# Patient Record
Sex: Female | Born: 1935 | Race: White | Hispanic: No | Marital: Married | Smoking: Never smoker
Health system: Southern US, Community
[De-identification: ages and names within clinical notes are randomized; demographics above are authoritative.]

## PROBLEM LIST (undated history)

## (undated) DIAGNOSIS — M1990 Unspecified osteoarthritis, unspecified site: Secondary | ICD-10-CM

## (undated) DIAGNOSIS — H3530 Unspecified macular degeneration: Secondary | ICD-10-CM

## (undated) DIAGNOSIS — Z8719 Personal history of other diseases of the digestive system: Secondary | ICD-10-CM

## (undated) DIAGNOSIS — K5900 Constipation, unspecified: Secondary | ICD-10-CM

## (undated) DIAGNOSIS — Z9289 Personal history of other medical treatment: Secondary | ICD-10-CM

## (undated) DIAGNOSIS — I2699 Other pulmonary embolism without acute cor pulmonale: Secondary | ICD-10-CM

## (undated) DIAGNOSIS — I10 Essential (primary) hypertension: Secondary | ICD-10-CM

## (undated) DIAGNOSIS — I82409 Acute embolism and thrombosis of unspecified deep veins of unspecified lower extremity: Secondary | ICD-10-CM

## (undated) DIAGNOSIS — R011 Cardiac murmur, unspecified: Secondary | ICD-10-CM

## (undated) HISTORY — PX: TONSILLECTOMY: SUR1361

## (undated) HISTORY — PX: SHOULDER SURGERY: SHX246

## (undated) HISTORY — PX: EYE SURGERY: SHX253

## (undated) HISTORY — PX: OTHER SURGICAL HISTORY: SHX169

## (undated) HISTORY — PX: ABDOMINAL HYSTERECTOMY: SHX81

---

## 2016-06-02 NOTE — H&P (Signed)
TOTAL HIP ADMISSION H&P  Patient is admitted for left total hip arthroplasty.  Subjective:  Chief Complaint: left hip pain  HPI: Sheila Stone, 83 y.o. female, has a history of pain and functional disability in the left hip(s) due to arthritis and patient has failed non-surgical conservative treatments for greater than 12 weeks to include NSAID's and/or analgesics, corticosteriod injections, flexibility and strengthening excercises, use of assistive devices, weight reduction as appropriate and activity modification.  Onset of symptoms was gradual starting 5 years ago with gradually worsening course since that time.The patient noted no past surgery on the left hip(s).  Patient currently rates pain in the left hip at 10 out of 10 with activity. Patient has night pain, worsening of pain with activity and weight bearing, trendelenberg gait, pain that interfers with activities of daily living and crepitus. Patient has evidence of subchondral cysts, subchondral sclerosis, periarticular osteophytes and joint space narrowing by imaging studies. This condition presents safety issues increasing the risk of falls. There is no current active infection.  There are no active problems to display for this patient.  No past medical history on file.  No past surgical history on file.  No prescriptions prior to admission.   Allergies  Allergen Reactions  . Other     Alcohol - throat swells     Social History  Substance Use Topics  . Smoking status: Not on file  . Smokeless tobacco: Not on file  . Alcohol use Not on file    No family history on file.   Review of Systems  Musculoskeletal: Positive for joint pain.       Left hip  All other systems reviewed and are negative.   Objective:  Physical Exam  Constitutional: She is oriented to person, place, and time. She appears well-developed and well-nourished.  HENT:  Head: Normocephalic and atraumatic.  Eyes: Pupils are equal, round, and  reactive to light.  Neck: Normal range of motion.  Cardiovascular: Normal rate and regular rhythm.   Respiratory: Effort normal.  GI: Soft.  Musculoskeletal:  Examination left hip shows pain with internal rotation.  Range of motion is rather limited.  No tenderness to palpation laterally.  She has 5 out of 5 strength normal sensation to her left lower extremity.  She is neurovascularly intact distally.   Neurological: She is alert and oriented to person, place, and time.  Skin: Skin is warm and dry.  Psychiatric: She has a normal mood and affect. Her behavior is normal. Judgment and thought content normal.    Vital signs in last 24 hours:    Labs:   There is no height or weight on file to calculate BMI.   Imaging Review Plain radiographs demonstrate severe degenerative joint disease of the left hip(s). The bone quality appears to be good for age and reported activity level.  Assessment/Plan:  End stage primary arthritis, left hip(s)  The patient history, physical examination, clinical judgement of the provider and imaging studies are consistent with end stage degenerative joint disease of the left hip(s) and total hip arthroplasty is deemed medically necessary. The treatment options including medical management, injection therapy, arthroscopy and arthroplasty were discussed at length. The risks and benefits of total hip arthroplasty were presented and reviewed. The risks due to aseptic loosening, infection, stiffness, dislocation/subluxation,  thromboembolic complications and other imponderables were discussed.  The patient acknowledged the explanation, agreed to proceed with the plan and consent was signed. Patient is being admitted for inpatient treatment for surgery,  pain control, PT, OT, prophylactic antibiotics, VTE prophylaxis, progressive ambulation and ADL's and discharge planning.The patient is planning to be discharged home with home health services

## 2016-06-04 ENCOUNTER — Encounter (HOSPITAL_COMMUNITY): Payer: Self-pay | Admitting: *Deleted

## 2016-06-04 ENCOUNTER — Encounter (HOSPITAL_COMMUNITY)
Admission: RE | Admit: 2016-06-04 | Discharge: 2016-06-04 | Disposition: A | Discharge status: Home / Self Care | Payer: Medicare Other | Source: Ambulatory Visit | Attending: Orthopaedic Surgery | Admitting: Orthopaedic Surgery

## 2016-06-04 DIAGNOSIS — I10 Essential (primary) hypertension: Secondary | ICD-10-CM | POA: Diagnosis not present

## 2016-06-04 DIAGNOSIS — R010 Benign and innocent cardiac murmurs: Secondary | ICD-10-CM | POA: Diagnosis not present

## 2016-06-04 DIAGNOSIS — Z01812 Encounter for preprocedural laboratory examination: Secondary | ICD-10-CM | POA: Diagnosis present

## 2016-06-04 HISTORY — DX: Other pulmonary embolism without acute cor pulmonale: I26.99

## 2016-06-04 HISTORY — DX: Unspecified osteoarthritis, unspecified site: M19.90

## 2016-06-04 HISTORY — DX: Acute embolism and thrombosis of unspecified deep veins of unspecified lower extremity: I82.409

## 2016-06-04 HISTORY — DX: Personal history of other diseases of the digestive system: Z87.19

## 2016-06-04 HISTORY — DX: Cardiac murmur, unspecified: R01.1

## 2016-06-04 HISTORY — DX: Essential (primary) hypertension: I10

## 2016-06-04 HISTORY — DX: Unspecified macular degeneration: H35.30

## 2016-06-04 HISTORY — DX: Personal history of other medical treatment: Z92.89

## 2016-06-04 HISTORY — DX: Constipation, unspecified: K59.00

## 2016-06-04 LAB — BASIC METABOLIC PANEL
ANION GAP: 7 (ref 5–15)
BUN: 20 mg/dL (ref 6–20)
CALCIUM: 9.3 mg/dL (ref 8.9–10.3)
CO2: 28 mmol/L (ref 22–32)
Chloride: 104 mmol/L (ref 101–111)
Creatinine, Ser: 0.85 mg/dL (ref 0.44–1.00)
GFR calc Af Amer: >60 mL/min (ref >60)
GFR calc non Af Amer: >60 mL/min (ref >60)
GLUCOSE: 97 mg/dL (ref 65–99)
POTASSIUM: 4.6 mmol/L (ref 3.5–5.1)
Sodium: 139 mmol/L (ref 135–145)

## 2016-06-04 LAB — SURGICAL PCR SCREEN
MRSA, PCR: NEGATIVE
Staphylococcus aureus: NEGATIVE

## 2016-06-04 LAB — CBC
HEMATOCRIT: 38.6 % (ref 36.0–46.0)
HEMOGLOBIN: 13.1 g/dL (ref 12.0–15.0)
MCH: 30.8 pg (ref 26.0–34.0)
MCHC: 33.9 g/dL (ref 30.0–36.0)
MCV: 90.6 fL (ref 78.0–100.0)
Platelets: 203 10*3/uL (ref 150–400)
RBC: 4.26 MIL/uL (ref 3.87–5.11)
RDW: 13.6 % (ref 11.5–15.5)
WBC: 4.4 10*3/uL (ref 4.0–10.5)

## 2016-06-04 NOTE — Pre-Procedure Instructions (Signed)
    Sheila Stone  06/04/2016    Your procedure is scheduled on Tuesday, May 22.  [facility] 10:00 AM               For any other questions, please call [phone], Monday - Friday 8 AM - 4 PM.   Call this number if you have problems the morning of surgery:[phone]                   For any other questions, please call [phone], Monday - Friday 8 AM - 4 PM.   Remember:  Do not eat food or drink liquids after midnight Monday, May 21.  Take these medicines the morning of surgery with A SIP OF WATER: levothyroxine (SYNTHROID, LEVOTHROID), omeprazole (PRILOSEC).  Take if needed and if you tolerate it on an empty stomach: HYDROcodone-acetaminophen (NORCO).                    Aspirin as instructed by your surgeon.            STOP taking Aspirin Products (Goody Powder, Excedrin Migraine), Ibuprofen (Advil), Naproxen (Aleve), Viatiams and Herbal Products (ie Fish Oil).   Do not wear jewelry, make-up or nail polish.  Do not wear lotions, powders, or perfumes, or deodorant.  Do not shave 48 hours prior to surgery.  Men may shave face and neck.  Do not bring valuables to the hospital.  [facility] is not responsible for any belongings or valuables.  Contacts, dentures or bridgework may not be worn into surgery.  Leave your suitcase in the car.  After surgery it may be brought to your room.  For patients admitted to the hospital, discharge time will be determined by your treatment team.   Special instructions: Review  [place] - Preparing For Surgery.  Please read over the following fact sheets that you were given: [facility]- Preparing For Surgery and Patient Instructions for Mupirocin Application, Coughing and Deep Breathing, Pain Booklet, Surgical Site Infections

## 2016-06-04 NOTE — Progress Notes (Signed)
Sheila Stone denies chest pain or shortness of breath.  Patient was seen in ED at [facility] for chest apian in 11/2015- MI ruled out, patient has a Hitall Hernia. Patient has not had chest pain since ED visit.  Discharge note mentions Afib, - request cardiology visit post hospitalization, patient was unaware.Patient's PCP is Dr Dina RichManckamm.  Patient has a history of DVT after an injury to leg resulting in a PE.  Patient thinks this was around 2008, patient has been on Aspirin 81 mg since.  Dr Jerl Santosalldorf did not instruct patient to stop.

## 2016-06-04 NOTE — Progress Notes (Signed)
  [place]- Preparing For Surgery  Before surgery, you can play an important role. Because skin is not sterile, your skin needs to be as free of germs as possible. You can reduce the number of germs on your skin by washing with CHG (chlorahexidine gluconate) Soap before surgery.  CHG is an antiseptic cleaner which kills germs and bonds with the skin to continue killing germs even after washing.  Please do not use if you have an allergy to CHG or antibacterial soaps. If your skin becomes reddened/irritated stop using the CHG.  Do not shave (including legs and underarms) for at least 48 hours prior to first CHG shower. It is OK to shave your face.  Please follow these instructions carefully.   1. Shower the NIGHT BEFORE SURGERY and the MORNING OF SURGERY with CHG.   2. If you chose to wash your hair, wash your hair first as usual with your normal shampoo.  3. After you shampoo, rinse your hair and body thoroughly to remove the shampoo.  Then wash your face and private area with your personal soap, then rinse. 4. Use CHG as you would any other liquid soap. You can apply CHG directly to the skin and wash gently with a scrungie or a clean washcloth.   5. Apply the CHG Soap to your body ONLY FROM THE NECK DOWN.  Do not use on open wounds or open sores. Avoid contact with your eyes, ears, mouth and genitals (private parts). Wash genitals (private parts) with your normal soap.  6. Wash thoroughly, paying special attention to the area where your surgery will be performed.  7. Thoroughly rinse your body with warm water from the neck down.  8. DO NOT shower/wash with your normal soap after using and rinsing off the CHG Soap.  9. Pat yourself dry with a CLEAN TOWEL.   10. Wear CLEAN PAJAMAS   11. Place CLEAN SHEETS on your bed the night of your first shower and DO NOT SLEEP WITH PETS.   Day of Surgery: Do not apply any deodorants/lotions. Please wear clean clothes to the hospital/surgery  center.

## 2016-06-05 NOTE — Progress Notes (Addendum)
Anesthesia Chart Review: Patient is a 83 year old female scheduled for left THA, anterior approach on 06/09/16 by Dr. Jerl Santosalldorf.   History includes never smoker, HTN, murmur ("mitral valve"), DVT(following injury)/PE '08, GI bleed (NSAIDS), tonsillectomy, hysterectomy, varicose vein surgery, shoulder surgery. She was [facility] 11/30/15-12/02/15 for chest pain. Notes indicate that she negative troponin X 2, CTA chest was negative for PE bug showed incidental 5 mm LLL lung nodule. She had a non-ischemic stress test and echo showed EF 50-55% with moderate TR and mildly dilated ascending TAA. Of note, records also mention that an EKG from that admission showed afib, but both EKGs tracings reviewed, and I do not see afib--both tracings have predominantly sinus rhythm with visibile P waves but one with couplet PACs and one with a ~ 1.6 second pause.   PCP is Dr. Secundino GingerPanneer Stone [facility] (see Care Everywhere), last visit 05/25/16.  Meds include aspirin 81 mg, Lipitor, Norco, levothyroxine, lidocaine patch, losartan, omeprazole, Maxide 25.  EKGs from 11/30/15 [facility] reviewed. 18:09:50 showed ST at 103 bpm, occasional PACs (one couplet), non-specific ST abnormality. 22:16:00 showed NSR, sinus pause (~ 1.6 seconds), reverse r wave progression V2-3, occasional PAC.  Nuclear stress test 12/02/15 [facility]; Care Everywhere): IMPRESSION: No evidence of inducible ischemia. EF 73%.  Echo 12/01/15 ([facility]; Care Everywhere): Interpretation Summary The left ventricle is normal in size. There is mild concentric left ventricular hypertrophy. The left ventricular ejection fraction is normal (50-55%). The left ventricular wall motion is normal. Grade I mild diastolic dysfunction; abnormal relaxation pattern. The left and right atria are mildly dilated. There is trace mitral regurgitation. There is moderate (2+) tricuspid regurgitation. Dilated ascending aorta measuring 4cm in  diameter.  CTA Pulmonary 11/30/15 [facility]; Care Everywhere): IMPRESSION: 1.No CT evidence of acute pulmonary embolism. No acute aortic abnormality. 2.Large hiatal hernia/intrathoracic stomach. 3.Scattered atelectasis without evidence of pneumonia. 4.Small bilateral pulmonary measuring up to 5 mm in the left lower lobe. Reference Fleischner criteria guidelines below for follow-up recommendations. 5.Atherosclerosis including coronary artery disease. 6.Hypodense left thyroid, hepatic, and splenic lesions are incompletely characterized by CT. Excerpt from "Guidelines for Management of Incidental Pulmonary Nodules Detected on CT Images: From the Fleischner Society 2017"Radiology 2017; 000:1-16. Note: Guidelines do not apply to lung cancer screening, patients with immunosuppression, or patients with known primary tumor. Solid Single Nodule: 5 mm or smaller:  Low Risk: No routine follow-up High Risk: Optional CT at 12 months (Use most suspicious nodule as guide to management. Follow-up intervals may vary according to size and risk).  Preoperative labs noted. CBC and BMET WNL.  Reviewed case with Dr. Aleene DavidsonE. Fitzgerald, MD.  Will repeat EKG DOS. If acceptable, I anticipate pt can proceed as scheduled.   Sheila Mastngela Stone, Sheila Stone [facility]/Anesthesiology Phone: [phone] 06/05/2016 12:06 PM  Addendum: Sheila MessierKathy called from Dr. Atlee AbideManickam's office to acknowledge review of ED chest CTA from 11/30/15 showing small pulmonary nodules. He felt she was low risk (never smoker), but would review results with her and determine definitive plan for follow-up at her next visit.   Sheila Ochsllison Sheila Cossey, PA-C [facility]/Anesthesiology Phone [phone] 06/08/2016 11:20 AM

## 2016-06-09 ENCOUNTER — Encounter (HOSPITAL_COMMUNITY)
Admission: RE | Disposition: A | Discharge status: Home Health | Payer: Self-pay | Source: Ambulatory Visit | Attending: Orthopaedic Surgery

## 2016-06-09 ENCOUNTER — Inpatient Hospital Stay (HOSPITAL_COMMUNITY): Payer: Medicare Other

## 2016-06-09 ENCOUNTER — Inpatient Hospital Stay (HOSPITAL_COMMUNITY): Payer: Medicare Other | Admitting: Certified Registered Nurse Anesthetist

## 2016-06-09 ENCOUNTER — Inpatient Hospital Stay (HOSPITAL_COMMUNITY)
Admission: RE | Admit: 2016-06-09 | Discharge: 2016-06-10 | DRG: 470 | Disposition: A | Discharge status: Home Health | Payer: Medicare Other | Source: Ambulatory Visit | Attending: Orthopaedic Surgery | Admitting: Orthopaedic Surgery

## 2016-06-09 ENCOUNTER — Encounter (HOSPITAL_COMMUNITY): Payer: Self-pay | Admitting: Certified Registered Nurse Anesthetist

## 2016-06-09 ENCOUNTER — Inpatient Hospital Stay (HOSPITAL_COMMUNITY): Payer: Medicare Other | Admitting: Vascular Surgery

## 2016-06-09 DIAGNOSIS — Z86711 Personal history of pulmonary embolism: Secondary | ICD-10-CM

## 2016-06-09 DIAGNOSIS — M1612 Unilateral primary osteoarthritis, left hip: Secondary | ICD-10-CM | POA: Diagnosis not present

## 2016-06-09 DIAGNOSIS — M25552 Pain in left hip: Secondary | ICD-10-CM | POA: Diagnosis present

## 2016-06-09 DIAGNOSIS — Z419 Encounter for procedure for purposes other than remedying health state, unspecified: Secondary | ICD-10-CM

## 2016-06-09 DIAGNOSIS — H3530 Unspecified macular degeneration: Secondary | ICD-10-CM | POA: Diagnosis not present

## 2016-06-09 DIAGNOSIS — I10 Essential (primary) hypertension: Secondary | ICD-10-CM | POA: Diagnosis present

## 2016-06-09 HISTORY — PX: TOTAL HIP ARTHROPLASTY: SHX124

## 2016-06-09 LAB — DIFFERENTIAL
Basophils Absolute: 0 10*3/uL (ref 0.0–0.1)
Basophils Relative: 0 %
EOS ABS: 0.1 10*3/uL (ref 0.0–0.7)
EOS PCT: 2 %
LYMPHS ABS: 1.3 10*3/uL (ref 0.7–4.0)
Lymphocytes Relative: 27 %
MONO ABS: 0.3 10*3/uL (ref 0.1–1.0)
MONOS PCT: 6 %
Neutro Abs: 3.1 10*3/uL (ref 1.7–7.7)
Neutrophils Relative %: 65 %

## 2016-06-09 LAB — PROTIME-INR
INR: 0.99
Prothrombin Time: 13.1 seconds (ref 11.4–15.2)

## 2016-06-09 LAB — URINALYSIS, ROUTINE W REFLEX MICROSCOPIC
BACTERIA UA: NONE SEEN
BILIRUBIN URINE: NEGATIVE
Glucose, UA: NEGATIVE mg/dL
Hgb urine dipstick: NEGATIVE
KETONES UR: NEGATIVE mg/dL
Nitrite: NEGATIVE
PH: 7 (ref 5.0–8.0)
Protein, ur: NEGATIVE mg/dL
Specific Gravity, Urine: 1.01 (ref 1.005–1.030)

## 2016-06-09 LAB — TYPE AND SCREEN
ABO/RH(D): A POS
ANTIBODY SCREEN: NEGATIVE

## 2016-06-09 LAB — APTT: APTT: 32 s (ref 24–36)

## 2016-06-09 LAB — ABO/RH: ABO/RH(D): A POS

## 2016-06-09 SURGERY — ARTHROPLASTY, HIP, TOTAL, ANTERIOR APPROACH
Anesthesia: Monitor Anesthesia Care | Site: Hip | Laterality: Left

## 2016-06-09 MED ORDER — ACETAMINOPHEN 325 MG PO TABS: 650.0000 mg | ORAL_TABLET | Freq: Four times a day (QID) | ORAL | Status: DC | PRN

## 2016-06-09 MED ORDER — METOCLOPRAMIDE HCL 5 MG/ML IJ SOLN: 5.0000 mg | Freq: Three times a day (TID) | INTRAMUSCULAR | Status: DC | PRN

## 2016-06-09 MED ORDER — HYDROCODONE-ACETAMINOPHEN 7.5-325 MG PO TABS
1.0000 | ORAL_TABLET | ORAL | Status: DC | PRN
  Administered 2016-06-09 – 2016-06-10 (×2): 2 via ORAL
  Administered 2016-06-10: 1 via ORAL
  Administered 2016-06-10: 2 via ORAL
  Administered 2016-06-10: 1 via ORAL
  Administered 2016-06-10: 2 via ORAL
  Filled 2016-06-09 (×2): qty 2
  Filled 2016-06-09: qty 1
  Filled 2016-06-09 (×3): qty 2

## 2016-06-09 MED ORDER — FENTANYL CITRATE (PF) 250 MCG/5ML IJ SOLN
INTRAMUSCULAR | Status: AC
  Filled 2016-06-09: qty 5

## 2016-06-09 MED ORDER — BUPIVACAINE LIPOSOME 1.3 % IJ SUSP
INTRAMUSCULAR | Status: DC | PRN
  Administered 2016-06-09: 20 mL

## 2016-06-09 MED ORDER — BUPIVACAINE HCL (PF) 0.25 % IJ SOLN
INTRAMUSCULAR | Status: AC
  Filled 2016-06-09: qty 30

## 2016-06-09 MED ORDER — PHENOL 1.4 % MT LIQD: 1.0000 | OROMUCOSAL | Status: DC | PRN

## 2016-06-09 MED ORDER — METHOCARBAMOL 1000 MG/10ML IJ SOLN
500.0000 mg | Freq: Four times a day (QID) | INTRAVENOUS | Status: DC | PRN
  Filled 2016-06-09: qty 5

## 2016-06-09 MED ORDER — DIPHENHYDRAMINE-APAP (SLEEP) 25-500 MG PO TABS: 1.0000 | ORAL_TABLET | Freq: Every evening | ORAL | Status: DC | PRN

## 2016-06-09 MED ORDER — ONDANSETRON HCL 4 MG/2ML IJ SOLN
INTRAMUSCULAR | Status: AC
  Filled 2016-06-09: qty 4

## 2016-06-09 MED ORDER — DEXAMETHASONE SODIUM PHOSPHATE 10 MG/ML IJ SOLN
INTRAMUSCULAR | Status: AC
  Filled 2016-06-09: qty 4

## 2016-06-09 MED ORDER — DOCUSATE SODIUM 100 MG PO CAPS
100.0000 mg | ORAL_CAPSULE | Freq: Two times a day (BID) | ORAL | Status: DC
  Administered 2016-06-09: 100 mg via ORAL
  Filled 2016-06-09 (×2): qty 1

## 2016-06-09 MED ORDER — TRIAMTERENE-HCTZ 37.5-25 MG PO TABS
1.0000 | ORAL_TABLET | Freq: Every day | ORAL | Status: DC
  Administered 2016-06-09 – 2016-06-10 (×2): 1 via ORAL
  Filled 2016-06-09 (×2): qty 1

## 2016-06-09 MED ORDER — PROPOFOL 10 MG/ML IV BOLUS
INTRAVENOUS | Status: DC | PRN
  Administered 2016-06-09 (×2): 10 mg via INTRAVENOUS

## 2016-06-09 MED ORDER — HYDROMORPHONE HCL 1 MG/ML IJ SOLN
0.2500 mg | INTRAMUSCULAR | Status: DC | PRN
Start: 2016-06-09 — End: 2016-06-09

## 2016-06-09 MED ORDER — APIXABAN 2.5 MG PO TABS
2.5000 mg | ORAL_TABLET | Freq: Two times a day (BID) | ORAL | Status: DC
  Filled 2016-06-09: qty 1

## 2016-06-09 MED ORDER — ROCURONIUM BROMIDE 10 MG/ML (PF) SYRINGE
PREFILLED_SYRINGE | INTRAVENOUS | Status: AC
  Filled 2016-06-09: qty 5

## 2016-06-09 MED ORDER — PANTOPRAZOLE SODIUM 40 MG PO TBEC
40.0000 mg | DELAYED_RELEASE_TABLET | Freq: Every day | ORAL | Status: DC
  Filled 2016-06-09: qty 1

## 2016-06-09 MED ORDER — MENTHOL 3 MG MT LOZG: 1.0000 | LOZENGE | OROMUCOSAL | Status: DC | PRN

## 2016-06-09 MED ORDER — LIDOCAINE 2% (20 MG/ML) 5 ML SYRINGE
INTRAMUSCULAR | Status: AC
  Filled 2016-06-09: qty 15

## 2016-06-09 MED ORDER — CEFAZOLIN SODIUM-DEXTROSE 2-4 GM/100ML-% IV SOLN
2.0000 g | Freq: Four times a day (QID) | INTRAVENOUS | Status: AC
  Administered 2016-06-09 – 2016-06-10 (×2): 2 g via INTRAVENOUS
  Filled 2016-06-09 (×2): qty 100

## 2016-06-09 MED ORDER — ONDANSETRON HCL 4 MG/2ML IJ SOLN
INTRAMUSCULAR | Status: DC | PRN
  Administered 2016-06-09: 4 mg via INTRAVENOUS

## 2016-06-09 MED ORDER — DIPHENHYDRAMINE HCL 12.5 MG/5ML PO ELIX: 12.5000 mg | ORAL_SOLUTION | ORAL | Status: DC | PRN

## 2016-06-09 MED ORDER — LACTATED RINGERS IV SOLN
INTRAVENOUS | Status: DC
  Administered 2016-06-09 (×2): via INTRAVENOUS

## 2016-06-09 MED ORDER — FENTANYL CITRATE (PF) 100 MCG/2ML IJ SOLN
INTRAMUSCULAR | Status: DC | PRN
  Administered 2016-06-09 (×2): 25 ug via INTRAVENOUS

## 2016-06-09 MED ORDER — OXYCODONE HCL 5 MG/5ML PO SOLN: 5.0000 mg | Freq: Once | ORAL | Status: DC | PRN

## 2016-06-09 MED ORDER — ONDANSETRON HCL 4 MG/2ML IJ SOLN
4.0000 mg | Freq: Four times a day (QID) | INTRAMUSCULAR | Status: DC | PRN
  Administered 2016-06-10: 4 mg via INTRAVENOUS
  Filled 2016-06-09: qty 2

## 2016-06-09 MED ORDER — ASPIRIN EC 81 MG PO TBEC
81.0000 mg | DELAYED_RELEASE_TABLET | Freq: Every morning | ORAL | Status: DC
  Administered 2016-06-09: 81 mg via ORAL
  Filled 2016-06-09 (×2): qty 1

## 2016-06-09 MED ORDER — ATORVASTATIN CALCIUM 10 MG PO TABS
10.0000 mg | ORAL_TABLET | Freq: Every evening | ORAL | Status: DC
  Administered 2016-06-09: 10 mg via ORAL
  Filled 2016-06-09: qty 1

## 2016-06-09 MED ORDER — METHOCARBAMOL 500 MG PO TABS
500.0000 mg | ORAL_TABLET | Freq: Four times a day (QID) | ORAL | Status: DC | PRN
  Administered 2016-06-10: 500 mg via ORAL
  Filled 2016-06-09: qty 1

## 2016-06-09 MED ORDER — BISACODYL 5 MG PO TBEC: 5.0000 mg | DELAYED_RELEASE_TABLET | Freq: Every day | ORAL | Status: DC | PRN

## 2016-06-09 MED ORDER — EPINEPHRINE PF 1 MG/ML IJ SOLN
INTRAMUSCULAR | Status: AC
  Filled 2016-06-09: qty 1

## 2016-06-09 MED ORDER — 0.9 % SODIUM CHLORIDE (POUR BTL) OPTIME
TOPICAL | Status: DC | PRN
  Administered 2016-06-09: 1000 mL

## 2016-06-09 MED ORDER — LIDOCAINE 5 % EX PTCH
1.0000 | MEDICATED_PATCH | Freq: Every day | CUTANEOUS | Status: DC | PRN
Start: 2016-06-09 — End: 2016-06-10
  Filled 2016-06-09: qty 1

## 2016-06-09 MED ORDER — CEFAZOLIN SODIUM-DEXTROSE 2-4 GM/100ML-% IV SOLN
2.0000 g | INTRAVENOUS | Status: AC
  Administered 2016-06-09: 2 g via INTRAVENOUS
  Filled 2016-06-09: qty 100

## 2016-06-09 MED ORDER — DEXAMETHASONE SODIUM PHOSPHATE 10 MG/ML IJ SOLN
INTRAMUSCULAR | Status: DC | PRN
  Administered 2016-06-09: 10 mg via INTRAVENOUS

## 2016-06-09 MED ORDER — PHENYLEPHRINE HCL 10 MG/ML IJ SOLN
INTRAVENOUS | Status: DC | PRN
  Administered 2016-06-09: 10 ug/min via INTRAVENOUS

## 2016-06-09 MED ORDER — LACTATED RINGERS IV SOLN
INTRAVENOUS | Status: DC
  Administered 2016-06-09: 19:00:00 via INTRAVENOUS

## 2016-06-09 MED ORDER — PROPOFOL 500 MG/50ML IV EMUL
INTRAVENOUS | Status: DC | PRN
  Administered 2016-06-09: 50 ug/kg/min via INTRAVENOUS

## 2016-06-09 MED ORDER — LEVOTHYROXINE SODIUM 50 MCG PO TABS
50.0000 ug | ORAL_TABLET | Freq: Every day | ORAL | Status: DC
  Administered 2016-06-10 (×2): 50 ug via ORAL
  Filled 2016-06-09: qty 1

## 2016-06-09 MED ORDER — POLYETHYLENE GLYCOL 3350 17 G PO PACK: 17.0000 g | PACK | Freq: Every day | ORAL | Status: DC | PRN

## 2016-06-09 MED ORDER — PROMETHAZINE HCL 25 MG/ML IJ SOLN: 6.2500 mg | INTRAMUSCULAR | Status: DC | PRN

## 2016-06-09 MED ORDER — ONDANSETRON HCL 4 MG PO TABS: 4.0000 mg | ORAL_TABLET | Freq: Four times a day (QID) | ORAL | Status: DC | PRN

## 2016-06-09 MED ORDER — BUPIVACAINE-EPINEPHRINE 0.25% -1:200000 IJ SOLN
INTRAMUSCULAR | Status: DC | PRN
  Administered 2016-06-09: 20 mL

## 2016-06-09 MED ORDER — BUPIVACAINE LIPOSOME 1.3 % IJ SUSP
20.0000 mL | Freq: Once | INTRAMUSCULAR | Status: DC
  Filled 2016-06-09: qty 20

## 2016-06-09 MED ORDER — HYDROMORPHONE HCL 1 MG/ML IJ SOLN
0.5000 mg | INTRAMUSCULAR | Status: DC | PRN
  Administered 2016-06-09 – 2016-06-10 (×2): 1 mg via INTRAVENOUS
  Filled 2016-06-09 (×3): qty 1

## 2016-06-09 MED ORDER — CHLORHEXIDINE GLUCONATE 4 % EX LIQD: 60.0000 mL | Freq: Once | CUTANEOUS | Status: DC

## 2016-06-09 MED ORDER — LOSARTAN POTASSIUM 50 MG PO TABS
100.0000 mg | ORAL_TABLET | Freq: Every evening | ORAL | Status: DC
  Administered 2016-06-09: 100 mg via ORAL
  Filled 2016-06-09: qty 2

## 2016-06-09 MED ORDER — ACETAMINOPHEN 650 MG RE SUPP: 650.0000 mg | Freq: Four times a day (QID) | RECTAL | Status: DC | PRN

## 2016-06-09 MED ORDER — SODIUM CHLORIDE 0.9 % IV SOLN
2000.0000 mg | INTRAVENOUS | Status: DC
  Filled 2016-06-09: qty 20

## 2016-06-09 MED ORDER — OXYCODONE HCL 5 MG PO TABS: 5.0000 mg | ORAL_TABLET | Freq: Once | ORAL | Status: DC | PRN

## 2016-06-09 MED ORDER — ALUM & MAG HYDROXIDE-SIMETH 200-200-20 MG/5ML PO SUSP: 30.0000 mL | ORAL | Status: DC | PRN

## 2016-06-09 MED ORDER — METOCLOPRAMIDE HCL 5 MG PO TABS: 5.0000 mg | ORAL_TABLET | Freq: Three times a day (TID) | ORAL | Status: DC | PRN

## 2016-06-09 SURGICAL SUPPLY — 52 items
BLADE SAW SGTL 18X1.27X75 (BLADE) ×2 IMPLANT
BLADE SAW SGTL 18X1.27X75MM (BLADE) ×1
CAPT HIP TOTAL 2 ×3 IMPLANT
CELLS DAT CNTRL 66122 CELL SVR (MISCELLANEOUS) ×1 IMPLANT
CLOSURE STERI-STRIP 1/2X4 (GAUZE/BANDAGES/DRESSINGS) ×1
CLSR STERI-STRIP ANTIMIC 1/2X4 (GAUZE/BANDAGES/DRESSINGS) ×2 IMPLANT
COVER PERINEAL POST (MISCELLANEOUS) ×3 IMPLANT
COVER SURGICAL LIGHT HANDLE (MISCELLANEOUS) ×3 IMPLANT
DECANTER SPIKE VIAL GLASS SM (MISCELLANEOUS) ×3 IMPLANT
DRAPE C-ARM 42X72 X-RAY (DRAPES) ×3 IMPLANT
DRAPE STERI IOBAN 125X83 (DRAPES) ×3 IMPLANT
DRAPE U-SHAPE 47X51 STRL (DRAPES) ×9 IMPLANT
DRSG AQUACEL AG ADV 3.5X10 (GAUZE/BANDAGES/DRESSINGS) ×3 IMPLANT
DURAPREP 26ML APPLICATOR (WOUND CARE) ×3 IMPLANT
ELECT BLADE 4.0 EZ CLEAN MEGAD (MISCELLANEOUS)
ELECT REM PT RETURN 9FT ADLT (ELECTROSURGICAL) ×3
ELECTRODE BLDE 4.0 EZ CLN MEGD (MISCELLANEOUS) IMPLANT
ELECTRODE REM PT RTRN 9FT ADLT (ELECTROSURGICAL) ×1 IMPLANT
FACESHIELD WRAPAROUND (MASK) ×6 IMPLANT
GLOVE BIO SURGEON STRL SZ8 (GLOVE) ×6 IMPLANT
GLOVE BIOGEL PI IND STRL 8 (GLOVE) ×2 IMPLANT
GLOVE BIOGEL PI INDICATOR 8 (GLOVE) ×4
GOWN STRL REUS W/ TWL LRG LVL3 (GOWN DISPOSABLE) ×1 IMPLANT
GOWN STRL REUS W/ TWL XL LVL3 (GOWN DISPOSABLE) ×2 IMPLANT
GOWN STRL REUS W/TWL LRG LVL3 (GOWN DISPOSABLE) ×2
GOWN STRL REUS W/TWL XL LVL3 (GOWN DISPOSABLE) ×4
KIT BASIN OR (CUSTOM PROCEDURE TRAY) ×3 IMPLANT
KIT ROOM TURNOVER OR (KITS) ×3 IMPLANT
MANIFOLD NEPTUNE II (INSTRUMENTS) ×3 IMPLANT
NEEDLE 18GX1X1/2 (RX/OR ONLY) (NEEDLE) ×3 IMPLANT
NEEDLE 22X1 1/2 (OR ONLY) (NEEDLE) ×3 IMPLANT
NEEDLE FILTER BLUNT 18X 1/2SAF (NEEDLE) ×2
NEEDLE FILTER BLUNT 18X1 1/2 (NEEDLE) ×1 IMPLANT
NEEDLE HYPO 22GX1.5 SAFETY (NEEDLE) ×3 IMPLANT
NS IRRIG 1000ML POUR BTL (IV SOLUTION) ×3 IMPLANT
PACK TOTAL JOINT (CUSTOM PROCEDURE TRAY) ×3 IMPLANT
PAD ARMBOARD 7.5X6 YLW CONV (MISCELLANEOUS) ×3 IMPLANT
RTRCTR WOUND ALEXIS 18CM MED (MISCELLANEOUS) ×3
SUT ETHIBOND NAB CT1 #1 30IN (SUTURE) ×6 IMPLANT
SUT MNCRL AB 3-0 PS2 27 (SUTURE) ×3 IMPLANT
SUT VIC AB 1 CT1 27 (SUTURE) ×2
SUT VIC AB 1 CT1 27XBRD ANBCTR (SUTURE) ×1 IMPLANT
SUT VIC AB 2-0 CT1 27 (SUTURE) ×2
SUT VIC AB 2-0 CT1 TAPERPNT 27 (SUTURE) ×1 IMPLANT
SUT VIC AB 3-0 FS2 27 (SUTURE) ×3 IMPLANT
SUT VLOC 180 0 24IN GS25 (SUTURE) ×3 IMPLANT
SYR 50ML LL SCALE MARK (SYRINGE) ×2 IMPLANT
SYR TB 1ML LUER SLIP (SYRINGE) ×2 IMPLANT
SYRINGE 60CC LL (MISCELLANEOUS) ×3 IMPLANT
TOWEL OR 17X24 6PK STRL BLUE (TOWEL DISPOSABLE) ×3 IMPLANT
TOWEL OR 17X26 10 PK STRL BLUE (TOWEL DISPOSABLE) ×3 IMPLANT
TRAY CATH 16FR W/PLASTIC CATH (SET/KITS/TRAYS/PACK) IMPLANT

## 2016-06-09 NOTE — Op Note (Signed)
PRE-OP DIAGNOSIS:  LEFT HIP DEGENERATIVE JOINT DISEASE POST-OP DIAGNOSIS: same PROCEDURE:  LEFT TOTAL HIP ARTHROPLASTY ANTERIOR APPROACH ANESTHESIA:  Spinal and MAC SURGEON:  Melrose Nakayama MD ASSISTANT:  Loni Dolly PA-C   INDICATIONS FOR PROCEDURE:  The patient is a 83 y.o. female with a long history of a painful hip.  This has persisted despite multiple conservative measures.  The patient has persisted with pain and dysfunction making rest and activity difficult.  A total hip replacement is offered as surgical treatment.  Informed operative consent was obtained after discussion of possible complications including reaction to anesthesia, infection, neurovascular injury, dislocation, DVT, PE, and death.  The importance of the postoperative rehab program to optimize result was stressed with the patient.  SUMMARY OF FINDINGS AND PROCEDURE:  Under general anesthesia through a anterior approach an the Hana table a left THR was performed.  The patient had severe degenerative change and good bone quality.  We used DePuy components to replace the hip and these were size KA 12 Corail femur capped with a +5 85m ceramic hip ball.  On the acetabular side we used a size 54 Gription shell with a plus 0 neutral polyethylene liner.  We did use a hole eliminator.  ALoni DollyPA-C assisted throughout and was invaluable to the completion of the case in that he helped position and retract while I performed the procedure.  He also closed simultaneously to help minimize OR time.  I used fluoroscopy throughout the case to check position of components and leg lengths and read all these views myself.  DESCRIPTION OF PROCEDURE:  The patient was taken to the OR suite where general anesthetic was applied.  The patient was then positioned on the Hana table supine.  All bony prominences were appropriately padded.  Prep and drape was then performed in normal sterile fashion.  The patient was given kefzol preoperative antibiotic  and an appropriate time out was performed.  We then took an anterior approach to the left hip.  Dissection was taken through adipose to the tensor fascia lata fascia.  This structure was incised longitudinally and we dissected in the intermuscular interval just medial to this muscle.  Cobra retractors were placed superior and inferior to the femoral neck superficial to the capsule.  A capsular incision was then made and the retractors were placed along the femoral neck.  Xray was brought in to get a good level for the femoral neck cut which was made with an oscillating saw and osteotome.  The femoral head was removed with a corkscrew.  The acetabulum was exposed and some labral tissues were excised. Reaming was taken to the inside wall of the pelvis and sequentially up to 1 mm smaller than the actual component.  A trial of components was done and then the aforementioned acetabular shell was placed in appropriate tilt and anteversion confirmed by fluoroscopy. The liner was placed along with the hole eliminator and attention was turned to the femur.  The leg was brought down and over into adduction and the elevator bar was used to raise the femur up gently in the wound.  The piriformis was released with care taken to preserve the obturator internus attachment and all of the posterior capsule. The femur was reamed and then broached to the appropriate size.  A trial reduction was done and the aforementioned head and neck assembly gave uKoreathe best stability in extension with external rotation.  Leg lengths were felt to be about equal by fluoroscopic exam.  The trial components were removed and the wound irrigated.  We then placed the femoral component in appropriate anteversion.  The head was applied to a dry stem neck and the hip again reduced.  It was again stable in the aforementioned position.  The would was irrigated again followed by re-approximation of anterior capsule with ethibond suture. Tensor fascia was  repaired with V-loc suture  followed by deep closure with #O and #2 undyed vicryl.  Skin was closed with subQ stitch and steristrips followed by a sterile dressing.  EBL and IOF can be obtained from anesthesia records.  DISPOSITION:  The patient was extubated in the OR and taken to PACU in stable condition to be admitted to the Orthopedic Surgery for appropriate post-op care to include perioperative antibiotics and DVT prophylaxis.

## 2016-06-09 NOTE — Anesthesia Procedure Notes (Signed)
Procedure Name: MAC Date/Time: 06/09/2016 12:20 PM Performed by: Candis Shine Pre-anesthesia Checklist: Patient identified, Emergency Drugs available, Suction available, Patient being monitored and Timeout performed Patient Re-evaluated:Patient Re-evaluated prior to inductionOxygen Delivery Method: Simple face mask Dental Injury: Teeth and Oropharynx as per pre-operative assessment

## 2016-06-09 NOTE — Anesthesia Preprocedure Evaluation (Signed)
Anesthesia Evaluation  Patient identified by MRN, date of birth, ID band Patient awake    Reviewed: Allergy & Precautions, NPO status , Patient's Chart, lab work & pertinent test results  Airway Mallampati: II  TM Distance: >3 FB Neck ROM: Full    Dental no notable dental hx.    Pulmonary neg pulmonary ROS,    Pulmonary exam normal breath sounds clear to auscultation       Cardiovascular hypertension, negative cardio ROS Normal cardiovascular exam Rhythm:Regular Rate:Normal     Neuro/Psych negative neurological ROS  negative psych ROS   GI/Hepatic negative GI ROS, Neg liver ROS,   Endo/Other  negative endocrine ROS  Renal/GU negative Renal ROS  negative genitourinary   Musculoskeletal negative musculoskeletal ROS (+) Arthritis ,   Abdominal   Peds negative pediatric ROS (+)  Hematology negative hematology ROS (+)   Anesthesia Other Findings   Reproductive/Obstetrics negative OB ROS                             Anesthesia Physical Anesthesia Plan  ASA: II  Anesthesia Plan: Spinal and MAC   Post-op Pain Management:    Induction: Intravenous  Airway Management Planned: Simple Face Mask  Additional Equipment:   Intra-op Plan:   Post-operative Plan:   Informed Consent: I have reviewed the patients History and Physical, chart, labs and discussed the procedure including the risks, benefits and alternatives for the proposed anesthesia with the patient or authorized representative who has indicated his/her understanding and acceptance.   Dental advisory given  Plan Discussed with: CRNA  Anesthesia Plan Comments:         Anesthesia Quick Evaluation

## 2016-06-09 NOTE — Anesthesia Postprocedure Evaluation (Signed)
Anesthesia Post Note  Patient: Sheila Stone  Procedure(s) Performed: Procedure(s) (LRB): TOTAL HIP ARTHROPLASTY ANTERIOR APPROACH (Left)  Patient location during evaluation: PACU Anesthesia Type: MAC Level of consciousness: oriented and awake and alert Pain management: pain level controlled Vital Signs Assessment: post-procedure vital signs reviewed and stable Respiratory status: spontaneous breathing and respiratory function stable Cardiovascular status: blood pressure returned to baseline and stable Postop Assessment: no headache and no backache Anesthetic complications: no       Last Vitals:  Vitals:   06/09/16 1527 06/09/16 1530  BP: 138/89   Pulse: (!) 48 64  Resp: 13 19  Temp:      Last Pain:  Vitals:   06/09/16 1515  TempSrc:   PainSc: 0-No pain                 Lynda Rainwater

## 2016-06-09 NOTE — Transfer of Care (Signed)
Immediate Anesthesia Transfer of Care Note  Patient: Sheila Stone  Procedure(s) Performed: Procedure(s): TOTAL HIP ARTHROPLASTY ANTERIOR APPROACH (Left)  Patient Location: PACU  Anesthesia Type:MAC and Spinal  Level of Consciousness: awake, alert  and oriented  Airway & Oxygen Therapy: Patient Spontanous Breathing and Patient connected to face mask oxygen  Post-op Assessment: Report given to RN and Post -op Vital signs reviewed and stable  Post vital signs: Reviewed and stable  Last Vitals:  Vitals:   06/09/16 1044  BP: (!) 166/88  Pulse: 77  Resp: 20  Temp: 36.9 C    Last Pain:  Vitals:   06/09/16 1044  TempSrc: Oral      Patients Stated Pain Goal: 2 (61/68/37 2902)  Complications: No apparent anesthesia complications

## 2016-06-09 NOTE — Interval H&P Note (Signed)
History and Physical Interval Note:  06/09/2016 11:14 AM  Sheila Stone  has presented today for surgery, with the diagnosis of LEFT HIP DEGENERATIVE JOINT DISEASE  The various methods of treatment have been discussed with the patient and family. After consideration of risks, benefits and other options for treatment, the patient has consented to  Procedure(s): TOTAL HIP ARTHROPLASTY ANTERIOR APPROACH (Left) as a surgical intervention .  The patient's history has been reviewed, patient examined, no change in status, stable for surgery.  I have reviewed the patient's chart and labs.  Questions were answered to the patient's satisfaction.     [name] G

## 2016-06-10 ENCOUNTER — Encounter (HOSPITAL_COMMUNITY): Payer: Self-pay | Admitting: Orthopaedic Surgery

## 2016-06-10 MED ORDER — BISACODYL 5 MG PO TBEC
5.0000 mg | DELAYED_RELEASE_TABLET | Freq: Every day | ORAL | 0 refills | Status: AC | PRN
Start: 2016-06-10 — End: ?

## 2016-06-10 MED ORDER — METHOCARBAMOL 500 MG PO TABS: 500.0000 mg | ORAL_TABLET | Freq: Four times a day (QID) | ORAL | 0 refills | Status: AC | PRN

## 2016-06-10 MED ORDER — APIXABAN 2.5 MG PO TABS: 2.5000 mg | ORAL_TABLET | Freq: Two times a day (BID) | ORAL | 0 refills | Status: AC

## 2016-06-10 MED ORDER — DOCUSATE SODIUM 100 MG PO CAPS: 100.0000 mg | ORAL_CAPSULE | Freq: Two times a day (BID) | ORAL | 0 refills | Status: AC

## 2016-06-10 MED ORDER — HYDROCODONE-ACETAMINOPHEN 7.5-325 MG PO TABS: 1.0000 | ORAL_TABLET | ORAL | 0 refills | Status: AC | PRN

## 2016-06-10 NOTE — Progress Notes (Signed)
Subjective: 1 Day Post-Op Procedure(s) (LRB): TOTAL HIP ARTHROPLASTY ANTERIOR APPROACH (Left)  Activity level:  wbat Diet tolerance:  ok Voiding:  ok Patient reports pain as mild.    Objective: Vital signs in last 24 hours: Temp:  [97 F (36.1 C)-98.4 F (36.9 C)] 97.5 F (36.4 C) (05/23 0529) Pulse Rate:  [48-139] 62 (05/23 0529) Resp:  [11-24] 16 (05/23 0529) BP: (105-166)/(49-101) 120/64 (05/23 0529) SpO2:  [94 %-100 %] 100 % (05/23 0529)  Labs: No results for input(s): HGB in the last 72 hours. No results for input(s): WBC, RBC, HCT, PLT in the last 72 hours. No results for input(s): NA, K, CL, CO2, BUN, CREATININE, GLUCOSE, CALCIUM in the last 72 hours.  Recent Labs  06/09/16 1009  INR 0.99    Physical Exam:  Neurologically intact ABD soft Neurovascular intact Sensation intact distally Intact pulses distally Dorsiflexion/Plantar flexion intact Incision: dressing C/D/I and no drainage No cellulitis present Compartment soft  Assessment/Plan:  1 Day Post-Op Procedure(s) (LRB): TOTAL HIP ARTHROPLASTY ANTERIOR APPROACH (Left) Advance diet Up with therapy D/C IV fluids Discharge home with home health today if doing well and cleared by PT this afternoon. If not then tomorrow. Continue on Eliquis 2.5mg  BID x 4 weeks post op for DVT prevention. Follow up in office 2 weeks post op.  Sheila Stone, Sheila Stone 06/10/2016, 7:59 AM

## 2016-06-10 NOTE — Discharge Instructions (Signed)

## 2016-06-10 NOTE — Discharge Summary (Signed)
Patient ID: Sheila Stone MRN: [id number] DOB/AGE: 1928/10/05 83 y.o.  Admit date: 06/09/2016 Discharge date: 06/10/2016  Admission Diagnoses:  Principal Problem:   Primary localized osteoarthritis of left hip Active Problems:   Primary osteoarthritis of left hip   Discharge Diagnoses:  Same  Past Medical History:  Diagnosis Date  . Arthritis    Back and hip  . Constipation   . DVT (deep venous thrombosis) (HCC)   . Heart murmur    Mitral Valve  . History of blood transfusion    GI bleed from NSAIDs   . History of hiatal hernia   . Hypertension   . Macular degeneration   . Pulmonary embolism (HCC)    2008ish    Surgeries: Procedure(s): TOTAL HIP ARTHROPLASTY ANTERIOR APPROACH on 06/09/2016   Consultants:   Discharged Condition: Improved  Hospital Course: Sheila Stone is an 83 y.o. female who was admitted 06/09/2016 for operative treatment ofPrimary localized osteoarthritis of left hip. Patient has severe unremitting pain that affects sleep, daily activities, and work/hobbies. After pre-op clearance the patient was taken to the operating room on 06/09/2016 and underwent  Procedure(s): TOTAL HIP ARTHROPLASTY ANTERIOR APPROACH.    Patient was given perioperative antibiotics: Anti-infectives    Start     Dose/Rate Route Frequency Ordered Stop   06/09/16 1815  ceFAZolin (ANCEF) IVPB 2g/100 mL premix     2 g 200 mL/hr over 30 Minutes Intravenous Every 6 hours 06/09/16 1809 06/10/16 0439   06/09/16 0958  ceFAZolin (ANCEF) IVPB 2g/100 mL premix     2 g 200 mL/hr over 30 Minutes Intravenous On call to O.R. 06/09/16 4098 06/09/16 1235       Patient was given sequential compression devices, early ambulation, and chemoprophylaxis to prevent DVT.  Patient benefited maximally from hospital stay and there were no complications.    Recent vital signs: Patient Vitals for the past 24 hrs:  BP Temp Temp src Pulse Resp SpO2  06/10/16 0529 120/64 97.5 F (36.4 C) Oral 62  16 100 %  06/10/16 0125 (!) 109/55 97.7 F (36.5 C) Oral (!) 56 16 97 %  06/09/16 2132 (!) 105/49 - - 92 16 97 %  06/09/16 1838 (!) 147/83 98.3 F (36.8 C) Oral 80 - 95 %  06/09/16 1736 - 98.3 F (36.8 C) - - - -  06/09/16 1727 (!) 146/91 - - 66 18 96 %  06/09/16 1712 (!) 149/101 - - 71 14 96 %  06/09/16 1700 - - - 69 16 94 %  06/09/16 1657 140/89 - - (!) 56 20 99 %  06/09/16 1645 - - - 72 16 95 %  06/09/16 1642 (!) 143/78 - - 68 13 95 %  06/09/16 1630 - - - 77 19 97 %  06/09/16 1627 (!) 145/97 - - 67 16 96 %  06/09/16 1615 - - - (!) 58 14 96 %  06/09/16 1612 (!) 143/82 - - (!) 59 11 97 %  06/09/16 1600 - - - 70 19 99 %  06/09/16 1557 (!) 149/94 - - 73 13 97 %  06/09/16 1545 - - - 69 18 98 %  06/09/16 1542 (!) 143/91 - - (!) 139 13 96 %  06/09/16 1530 - - - 64 19 99 %  06/09/16 1527 138/89 - - (!) 48 13 100 %  06/09/16 1515 - - - (!) 108 16 95 %  06/09/16 1512 (!) 140/92 - - 76 (!) 24 94 %  06/09/16 1500 - - -  64 15 100 %  06/09/16 1457 (!) 144/91 - - (!) 55 17 99 %  06/09/16 1445 - - - - 17 -  06/09/16 1442 (!) 143/73 - - (!) 59 14 100 %  06/09/16 1430 - - - 63 19 100 %  06/09/16 1427 (!) 145/77 - - (!) 50 12 100 %  06/09/16 1415 - - - 72 16 100 %  06/09/16 1414 137/80 97 F (36.1 C) - 60 15 100 %  06/09/16 1412 - - - (!) 54 - 100 %     Recent laboratory studies:  Recent Labs  06/09/16 1009  INR 0.99     Discharge Medications:   Allergies as of 06/10/2016      Reactions   Sulfur Swelling   Drinking alcohol SWELLING REACTION UNSPECIFIED    Alcohol Swelling   SWELLING REACTION UNSPECIFIED    Codeine    UNSPECIFIED REACTION       Medication List    STOP taking these medications   ibuprofen 200 MG tablet Commonly known as:  ADVIL,MOTRIN     TAKE these medications   apixaban 2.5 MG Tabs tablet Commonly known as:  ELIQUIS Take 1 tablet (2.5 mg total) by mouth every 12 (twelve) hours.   aspirin EC 81 MG tablet Take 81 mg by mouth daily at 3 pm.    atorvastatin 10 MG tablet Commonly known as:  LIPITOR Take 10 mg by mouth every evening.   bisacodyl 5 MG EC tablet Commonly known as:  DULCOLAX Take 1 tablet (5 mg total) by mouth daily as needed for moderate constipation.   CALCIUM-D PO Take 1 tablet by mouth every evening.   diphenhydramine-acetaminophen 25-500 MG Tabs tablet Commonly known as:  TYLENOL PM Take 1 tablet by mouth at bedtime as needed (sleep).   docusate sodium 100 MG capsule Commonly known as:  COLACE Take 1 capsule (100 mg total) by mouth 2 (two) times daily.   HYDROcodone-acetaminophen 7.5-325 MG tablet Commonly known as:  NORCO Take 1-2 tablets by mouth every 4 (four) hours as needed for moderate pain. What changed:  how much to take  when to take this   levothyroxine 50 MCG tablet Commonly known as:  SYNTHROID, LEVOTHROID Take 50 mcg by mouth daily before breakfast.   Lidocaine 4 % Ptch Apply 1 patch topically daily as needed (pain).   losartan 100 MG tablet Commonly known as:  COZAAR Take 100 mg by mouth every evening.   methocarbamol 500 MG tablet Commonly known as:  ROBAXIN Take 1 tablet (500 mg total) by mouth every 6 (six) hours as needed for muscle spasms.   omeprazole 20 MG capsule Commonly known as:  PRILOSEC Take 20 mg by mouth every other day.   polyethylene glycol packet Commonly known as:  MIRALAX / GLYCOLAX Take 17 g by mouth daily as needed.   PRESERVISION AREDS PO Take 1 tablet by mouth 2 (two) times daily.   OCUVITE PRESERVISION PO Take 1 tablet by mouth 2 (two) times daily.   triamterene-hydrochlorothiazide 37.5-25 MG tablet Commonly known as:  MAXZIDE-25 Take 1 tablet by mouth daily.            Durable Medical Equipment        Start     Ordered   06/09/16 1810  DME Walker rolling  Once    Question:  Patient needs a walker to treat with the following condition  Answer:  Primary osteoarthritis of left hip   06/09/16 1809  06/09/16 1810  DME 3 n 1  Once      06/09/16 1809   06/09/16 1810  DME Bedside commode  Once    Question:  Patient needs a bedside commode to treat with the following condition  Answer:  Primary osteoarthritis of left hip   06/09/16 1809      Diagnostic Studies: Dg C-arm 1-60 Min  Result Date: 06/09/2016 CLINICAL DATA:  Left hip placement EXAM: DG C-ARM 61-120 MIN; OPERATIVE LEFT HIP WITH PELVIS COMPARISON:  None. FLUOROSCOPY TIME:  22 seconds FINDINGS: Interval left total hip arthroplasty without failure or complication. No fracture or dislocation. Postsurgical changes in the surrounding soft tissues. IMPRESSION: Interval left total hip arthroplasty. Electronically Signed   By: Elige Ko   On: 06/09/2016 16:06   Dg Hip Operative Unilat W Or W/o Pelvis Left  Result Date: 06/09/2016 CLINICAL DATA:  Left hip placement EXAM: DG C-ARM 61-120 MIN; OPERATIVE LEFT HIP WITH PELVIS COMPARISON:  None. FLUOROSCOPY TIME:  22 seconds FINDINGS: Interval left total hip arthroplasty without failure or complication. No fracture or dislocation. Postsurgical changes in the surrounding soft tissues. IMPRESSION: Interval left total hip arthroplasty. Electronically Signed   By: Elige Ko   On: 06/09/2016 16:06    Disposition: Final discharge disposition not confirmed  Discharge Instructions    Call MD / Call 911    Complete by:  As directed    If you experience chest pain or shortness of breath, CALL 911 and be transported to the hospital emergency room.  If you develope a fever above 101 F, pus (white drainage) or increased drainage or redness at the wound, or calf pain, call your surgeon's office.   Constipation Prevention    Complete by:  As directed    Drink plenty of fluids.  Prune juice may be helpful.  You may use a stool softener, such as Colace (over the counter) 100 mg twice a day.  Use MiraLax (over the counter) for constipation as needed.   Diet - low sodium heart healthy    Complete by:  As directed    Discharge  instructions    Complete by:  As directed    INSTRUCTIONS AFTER JOINT REPLACEMENT   Remove items at home which could result in a fall. This includes throw rugs or furniture in walking pathways ICE to the affected joint every three hours while awake for 30 minutes at a time, for at least the first 3-5 days, and then as needed for pain and swelling.  Continue to use ice for pain and swelling. You may notice swelling that will progress down to the foot and ankle.  This is normal after surgery.  Elevate your leg when you are not up walking on it.   Continue to use the breathing machine you got in the hospital (incentive spirometer) which will help keep your temperature down.  It is common for your temperature to cycle up and down following surgery, especially at night when you are not up moving around and exerting yourself.  The breathing machine keeps your lungs expanded and your temperature down.   DIET:  As you were doing prior to hospitalization, we recommend a well-balanced diet.  DRESSING / WOUND CARE / SHOWERING  You may shower 3 days after surgery, but keep the wounds dry during showering.  You may use an occlusive plastic wrap (Press'n Seal for example), NO SOAKING/SUBMERGING IN THE BATHTUB.  If the bandage gets wet, change with a clean dry gauze.  If the incision gets wet, pat the wound dry with a clean towel.  ACTIVITY  Increase activity slowly as tolerated, but follow the weight bearing instructions below.   No driving for 6 weeks or until further direction given by your physician.  You cannot drive while taking narcotics.  No lifting or carrying greater than 10 lbs. until further directed by your surgeon. Avoid periods of inactivity such as sitting longer than an hour when not asleep. This helps prevent blood clots.  You may return to work once you are authorized by your doctor.     WEIGHT BEARING   Weight bearing as tolerated with assist device (walker, cane, etc) as directed,  use it as long as suggested by your surgeon or therapist, typically at least 4-6 weeks.   EXERCISES  Results after joint replacement surgery are often greatly improved when you follow the exercise, range of motion and muscle strengthening exercises prescribed by your doctor. Safety measures are also important to protect the joint from further injury. Any time any of these exercises cause you to have increased pain or swelling, decrease what you are doing until you are comfortable again and then slowly increase them. If you have problems or questions, call your caregiver or physical therapist for advice.   Rehabilitation is important following a joint replacement. After just a few days of immobilization, the muscles of the leg can become weakened and shrink (atrophy).  These exercises are designed to build up the tone and strength of the thigh and leg muscles and to improve motion. Often times heat used for twenty to thirty minutes before working out will loosen up your tissues and help with improving the range of motion but do not use heat for the first two weeks following surgery (sometimes heat can increase post-operative swelling).   These exercises can be done on a training (exercise) mat, on the floor, on a table or on a bed. Use whatever works the best and is most comfortable for you.    Use music or television while you are exercising so that the exercises are a pleasant break in your day. This will make your life better with the exercises acting as a break in your routine that you can look forward to.   Perform all exercises about fifteen times, three times per day or as directed.  You should exercise both the operative leg and the other leg as well.   Exercises include:   Quad Sets - Tighten up the muscle on the front of the thigh (Quad) and hold for 5-10 seconds.   Straight Leg Raises - With your knee straight (if you were given a brace, keep it on), lift the leg to 60 degrees, hold for 3  seconds, and slowly lower the leg.  Perform this exercise against resistance later as your leg gets stronger.  Leg Slides: Lying on your back, slowly slide your foot toward your buttocks, bending your knee up off the floor (only go as far as is comfortable). Then slowly slide your foot back down until your leg is flat on the floor again.  Angel Wings: Lying on your back spread your legs to the side as far apart as you can without causing discomfort.  Hamstring Strength:  Lying on your back, push your heel against the floor with your leg straight by tightening up the muscles of your buttocks.  Repeat, but this time bend your knee to a comfortable angle, and push your heel against the floor.  You may put a pillow under the heel to make it more comfortable if necessary.   A rehabilitation program following joint replacement surgery can speed recovery and prevent re-injury in the future due to weakened muscles. Contact your doctor or a physical therapist for more information on knee rehabilitation.    CONSTIPATION  Constipation is defined medically as fewer than three stools per week and severe constipation as less than one stool per week.  Even if you have a regular bowel pattern at home, your normal regimen is likely to be disrupted due to multiple reasons following surgery.  Combination of anesthesia, postoperative narcotics, change in appetite and fluid intake all can affect your bowels.   YOU MUST use at least one of the following options; they are listed in order of increasing strength to get the job done.  They are all available over the counter, and you may need to use some, POSSIBLY even all of these options:    Drink plenty of fluids (prune juice may be helpful) and high fiber foods Colace 100 mg by mouth twice a day  Senokot for constipation as directed and as needed Dulcolax (bisacodyl), take with full glass of water  Miralax (polyethylene glycol) once or twice a day as needed.  If you  have tried all these things and are unable to have a bowel movement in the first 3-4 days after surgery call either your surgeon or your primary doctor.    If you experience loose stools or diarrhea, hold the medications until you stool forms back up.  If your symptoms do not get better within 1 week or if they get worse, check with your doctor.  If you experience "the worst abdominal pain ever" or develop nausea or vomiting, please contact the office immediately for further recommendations for treatment.   ITCHING:  If you experience itching with your medications, try taking only a single pain pill, or even half a pain pill at a time.  You can also use Benadryl over the counter for itching or also to help with sleep.   TED HOSE STOCKINGS:  Use stockings on both legs until for at least 2 weeks or as directed by physician office. They may be removed at night for sleeping.  MEDICATIONS:  See your medication summary on the "After Visit Summary" that nursing will review with you.  You may have some home medications which will be placed on hold until you complete the course of blood thinner medication.  It is important for you to complete the blood thinner medication as prescribed.  PRECAUTIONS:  If you experience chest pain or shortness of breath - call 911 immediately for transfer to the hospital emergency department.   If you develop a fever greater that 101 F, purulent drainage from wound, increased redness or drainage from wound, foul odor from the wound/dressing, or calf pain - CONTACT YOUR SURGEON.                                                   FOLLOW-UP APPOINTMENTS:  If you do not already have a post-op appointment, please call the office for an appointment to be seen by your surgeon.  Guidelines for how soon to be seen are listed in your "After Visit Summary", but are typically between 1-4 weeks after surgery.  OTHER INSTRUCTIONS:   Knee  Replacement:  Do not place pillow under knee, focus  on keeping the knee straight while resting. CPM instructions: 0-90 degrees, 2 hours in the morning, 2 hours in the afternoon, and 2 hours in the evening. Place foam block, curve side up under heel at all times except when in CPM or when walking.  DO NOT modify, tear, cut, or change the foam block in any way.  MAKE SURE YOU:  Understand these instructions.  Get help right away if you are not doing well or get worse.    Thank you for letting us be a part of your medical care team.  It is a privilege we respect greatly.  We hope these instructions will help you stay on track for a fast and full recovery!   Increase activity slowly as tolerated    Complete by:  As directed       Follow-up Information    Marcene Corning, MD. Schedule an appointment as soon as possible for a visit in 2 weeks.   Specialty:  Orthopedic Surgery Contact information: [address] [place] [address] [phone]            Signed: Drema Halon 06/10/2016, 2:07 PM

## 2016-06-10 NOTE — Progress Notes (Signed)
Physical Therapy Treatment Patient Details Name: Sheila Stone MRN: [id number] DOB: [birth year] Today's Date: 06/10/2016    History of Present Illness Pt is an 82yo female s/p L direct anterior THA. no PMH or PSH on file.    PT Comments    Pt with improved gait tolerance and kinematics. Pt requires v/c's for safe hand placement for sit to stand. Pt able to complete stair negotiation for safe d/c home once medically cleared.   Follow Up Recommendations  Home health PT;Supervision/Assistance - 24 hour     Equipment Recommendations  None recommended by PT    Recommendations for Other Services       Precautions / Restrictions Precautions Precautions: Fall Precaution Comments: no hip precaution Restrictions Weight Bearing Restrictions: Yes LLE Weight Bearing: Weight bearing as tolerated    Mobility  Bed Mobility               General bed mobility comments: pt up in chair  Transfers Overall transfer level: Needs assistance Equipment used: Rolling walker (2 wheeled) Transfers: Sit to/from Stand Sit to Stand: Supervision         General transfer comment: v/c's to push up from chair and to reach back for chair  Ambulation/Gait Ambulation/Gait assistance: Supervision Ambulation Distance (Feet): 400 Feet Assistive device: Rolling walker (2 wheeled) Gait Pattern/deviations: Step-through pattern Gait velocity: wfl for age and surgery done   General Gait Details: mild limp, v/c's to look ahead instead of down at floor   Stairs Stairs: Yes   Stair Management: With walker (platform step x 6 trials) Number of Stairs: 1 General stair comments: v/c's for sequencing of "up with the good, down with the bad"   Wheelchair Mobility    Modified Rankin (Stroke Patients Only)       Balance Overall balance assessment:  (able to stand at sink and wash hands and pull up pants )                                          Cognition  Arousal/Alertness: Awake/alert Behavior During Therapy: WFL for tasks assessed/performed Overall Cognitive Status: Within Functional Limits for tasks assessed                                 General Comments: noted short term memory deficits      Exercises      General Comments General comments (skin integrity, edema, etc.): assisted pt into bathroom. v/c's for safety to use grab bar and not RW. pt able to perform hygiene in sitting and wash hands and pull up underwear and pants with supervision      Pertinent Vitals/Pain Pain Assessment: 0-10 Pain Score: 6  Pain Location: L hip  Pain Descriptors / Indicators: Sore Pain Intervention(s): Patient requesting pain meds-RN notified    Home Living                      Prior Function            PT Goals (current goals can now be found in the care plan section) Acute Rehab PT Goals Patient Stated Goal: home Progress towards PT goals: Progressing toward goals    Frequency    7X/week      PT Plan Current plan remains appropriate    Co-evaluation  AM-PAC PT "6 Clicks" Daily Activity  Outcome Measure  Difficulty turning over in bed (including adjusting bedclothes, sheets and blankets)?: A Little Difficulty moving from lying on back to sitting on the side of the bed? : A Little Difficulty sitting down on and standing up from a chair with arms (e.g., wheelchair, bedside commode, etc,.)?: A Little Help needed moving to and from a bed to chair (including a wheelchair)?: A Little Help needed walking in hospital room?: A Little Help needed climbing 3-5 steps with a railing? : A Little 6 Click Score: 18    End of Session Equipment Utilized During Treatment: Gait belt Activity Tolerance: Patient tolerated treatment well Patient left: in chair;with call bell/phone within reach;with chair alarm set;with family/visitor present Nurse Communication: Mobility status PT Visit Diagnosis:  Unsteadiness on feet (R26.81);Muscle weakness (generalized) (M62.81);Pain Pain - Right/Left: Left Pain - part of body: Hip     Time: [id number] PT Time Calculation (min) (ACUTE ONLY): 18 min  Charges:  $Gait Training: 8-22 mins                    G Codes:       Sheila Stone, PT, DPT Pager #: [phone] Office #: [phone]    Sheila Stone 06/10/2016, 3:34 PM

## 2016-06-10 NOTE — Evaluation (Signed)
Physical Therapy Evaluation Patient Details Name: Sheila Stone MRN: [id number] DOB: 1928/03/09 Today's Date: 06/10/2016   History of Present Illness  Pt is an 83yo female s/p L direct anterior THA. no PMH or PSH on file.  Clinical Impression  Pt tolerating mobility well however c/o nausea. Anticipate pt with progress quickly as patient was very active before and is functioning at a min guard level currently. Will complete stair negotiation this afternoon in preparation for possible d/c later today per MD.    Follow Up Recommendations Home health PT;Supervision/Assistance - 24 hour    Equipment Recommendations  None recommended by PT    Recommendations for Other Services       Precautions / Restrictions Precautions Precautions: Fall Precaution Comments: no hip precautions Restrictions Weight Bearing Restrictions: Yes LLE Weight Bearing: Weight bearing as tolerated      Mobility  Bed Mobility Overal bed mobility: Modified Independent             General bed mobility comments: increased time, used bed rail, HOB flat, able to manage bialt LE without assist  Transfers Overall transfer level: Needs assistance Equipment used: Rolling walker (2 wheeled) Transfers: Sit to/from Stand Sit to Stand: Min guard         General transfer comment: increased time, excessive trunk flexion, v/c's for safe hand placement  Ambulation/Gait Ambulation/Gait assistance: Min guard Ambulation Distance (Feet): 250 Feet Assistive device: Rolling walker (2 wheeled) Gait Pattern/deviations: Step-through pattern;Antalgic Gait velocity: wfl   General Gait Details: mild limp, v/c's to decreased UE support and increased bilat LE WBing, v/c's to increased step length  Stairs            Wheelchair Mobility    Modified Rankin (Stroke Patients Only)       Balance Overall balance assessment:  (needs RW for safe ambulation)                                            Pertinent Vitals/Pain Pain Assessment: 0-10 Pain Score: 5  Pain Location: L hip Pain Descriptors / Indicators: Sore Pain Intervention(s): Premedicated before session (c/o nausea)    Home Living Family/patient expects to be discharged to:: Private residence Living Arrangements: Spouse/significant other Available Help at Discharge: Family;Available 24 hours/day Type of Home: House Home Access: Stairs to enter Entrance Stairs-Rails: Right (post to hold onto) Entrance Stairs-Number of Steps: 2 Home Layout: Two level;Able to live on main level with bedroom/bathroom Home Equipment: Grab bars - toilet;Grab bars - tub/shower;Hand held shower head;Shower seat - built in;Walker - 2 wheels;Cane - quad (toliet handicapped height)      Prior Function Level of Independence: Independent         Comments: was going to [employer] 3x/wk and was ballroom dancing     Hand Dominance        Extremity/Trunk Assessment   Upper Extremity Assessment Upper Extremity Assessment: Overall WFL for tasks assessed    Lower Extremity Assessment Lower Extremity Assessment: LLE deficits/detail LLE Deficits / Details: hip ROM limited to 70 deg due to onset of pain    Cervical / Trunk Assessment Cervical / Trunk Assessment: Kyphotic  Communication   Communication: HOH  Cognition Arousal/Alertness: Awake/alert Behavior During Therapy: WFL for tasks assessed/performed Overall Cognitive Status: Within Functional Limits for tasks assessed  General Comments General comments (skin integrity, edema, etc.): assist pt to bathroom, pt able to perform pericare and washing of hands with min guard    Exercises     Assessment/Plan    PT Assessment Patient needs continued PT services  PT Problem List Decreased strength;Decreased range of motion;Decreased activity tolerance;Decreased balance;Decreased mobility;Decreased knowledge of use of DME;Pain        PT Treatment Interventions DME instruction;Gait training;Functional mobility training;Stair training;Therapeutic activities;Therapeutic exercise;Balance training    PT Goals (Current goals can be found in the Care Plan section)  Acute Rehab PT Goals Patient Stated Goal: stop the nausea PT Goal Formulation: With patient Time For Goal Achievement: 06/17/16 Potential to Achieve Goals: Good    Frequency 7X/week   Barriers to discharge        Co-evaluation               AM-PAC PT "6 Clicks" Daily Activity  Outcome Measure Difficulty turning over in bed (including adjusting bedclothes, sheets and blankets)?: A Little Difficulty moving from lying on back to sitting on the side of the bed? : A Little Difficulty sitting down on and standing up from a chair with arms (e.g., wheelchair, bedside commode, etc,.)?: A Little Help needed moving to and from a bed to chair (including a wheelchair)?: A Little Help needed walking in hospital room?: A Little Help needed climbing 3-5 steps with a railing? : A Little 6 Click Score: 18    End of Session Equipment Utilized During Treatment: Gait belt Activity Tolerance: Patient tolerated treatment well Patient left: in chair;with call bell/phone within reach;with chair alarm set;with family/visitor present Nurse Communication: Mobility status (c/o nausea) PT Visit Diagnosis: Unsteadiness on feet (R26.81);Muscle weakness (generalized) (M62.81);Pain Pain - Right/Left: Left Pain - part of body: Hip    Time: 0707-0740 PT Time Calculation (min) (ACUTE ONLY): 33 min   Charges:   PT Evaluation $PT Eval Moderate Complexity: 1 Procedure PT Treatments $Gait Training: 8-22 mins   PT G Codes:       [name], PT, DPT Pager #: [phone] Office #: [phone]  [name] [name] 06/10/2016, 8:56 AM

## 2016-06-10 NOTE — Care Management Note (Signed)
Case Management Note  Patient Details  Name: Thersa SaltFlorence M Hissong MRN: [id number] Date of Birth: 25-May-1928  Subjective/Objective:     83 yr old female admitted admitted with osteoarthritis of her left hip. Underwent a left total hip arthroplasty.         Action/Plan: Case manager spoke with patient concerning discharge plan and DME needs. Choice for Home Health was offered. Referral was called to Ayesha RumpfMary Yonjof, [facility]. Patient says that she has a rolling walker and elevated toilet with a grab bar on the wall. She will have family support at discharge.    Expected Discharge Date:   06/10/16               Expected Discharge Plan:  Home w Home Health Services  In-House Referral:     Discharge planning Services  CM Consult  Post Acute Care Choice:  Home Health Choice offered to:  Patient  DME Arranged:  N/A DME Agency:  NA  HH Arranged:  PT HH Agency:  [facility]  Status of Service:  Completed, signed off  If discussed at [employer] Meetings, dates discussed:    Additional Comments:  Durenda GuthrieBrady, March Joos Naomi, RN 06/10/2016, 1:38 PM

## 2018-04-12 IMAGING — RF DG HIP (WITH PELVIS) OPERATIVE*L*
1 series · 4 of 4 positions shown · non-contrast
Comparison: None.

FLUOROSCOPY TIME:  22 seconds

CLINICAL DATA: Left hip placement

EXAM:
DG C-ARM 61-120 MIN; OPERATIVE LEFT HIP WITH PELVIS

[Series 1: run · 4 of 4 slices shown]
[im 1/4]
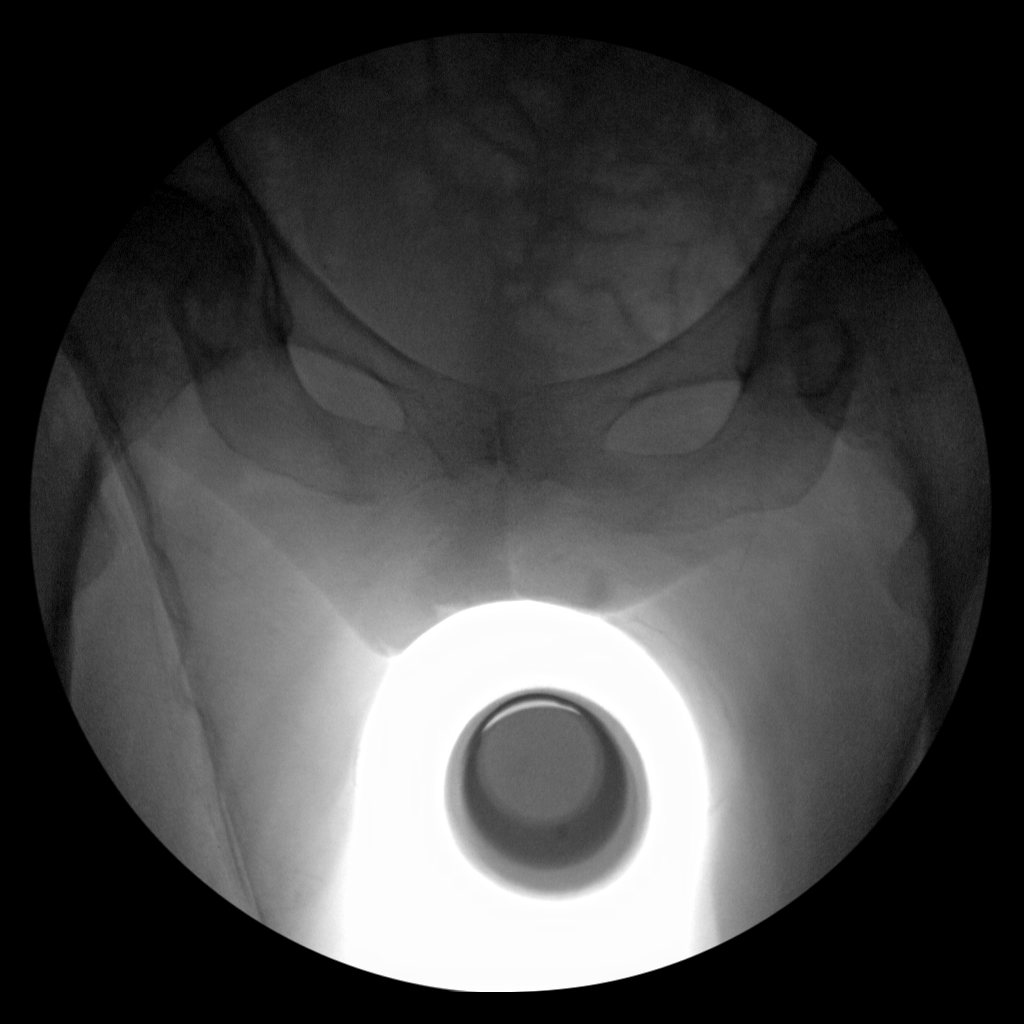
[im 2/4]
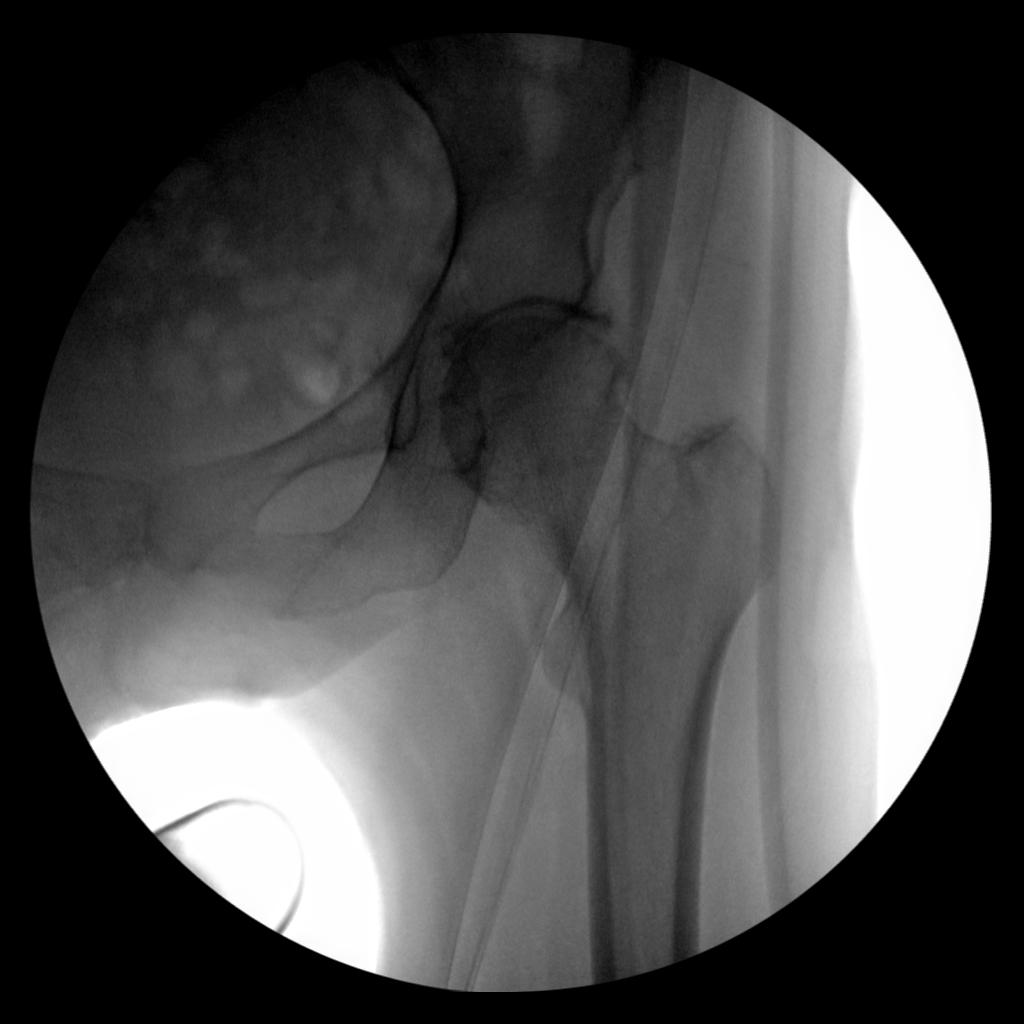
[im 3/4]
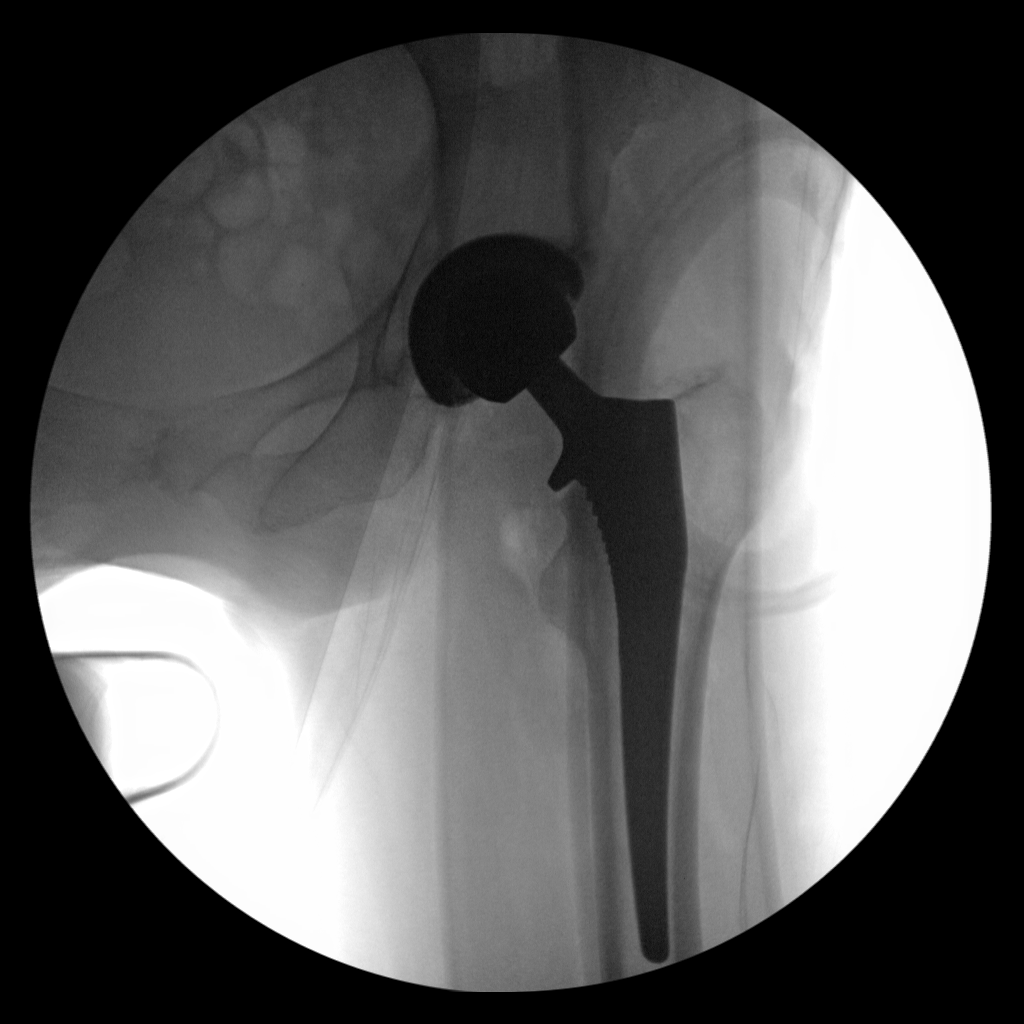
[im 4/4]
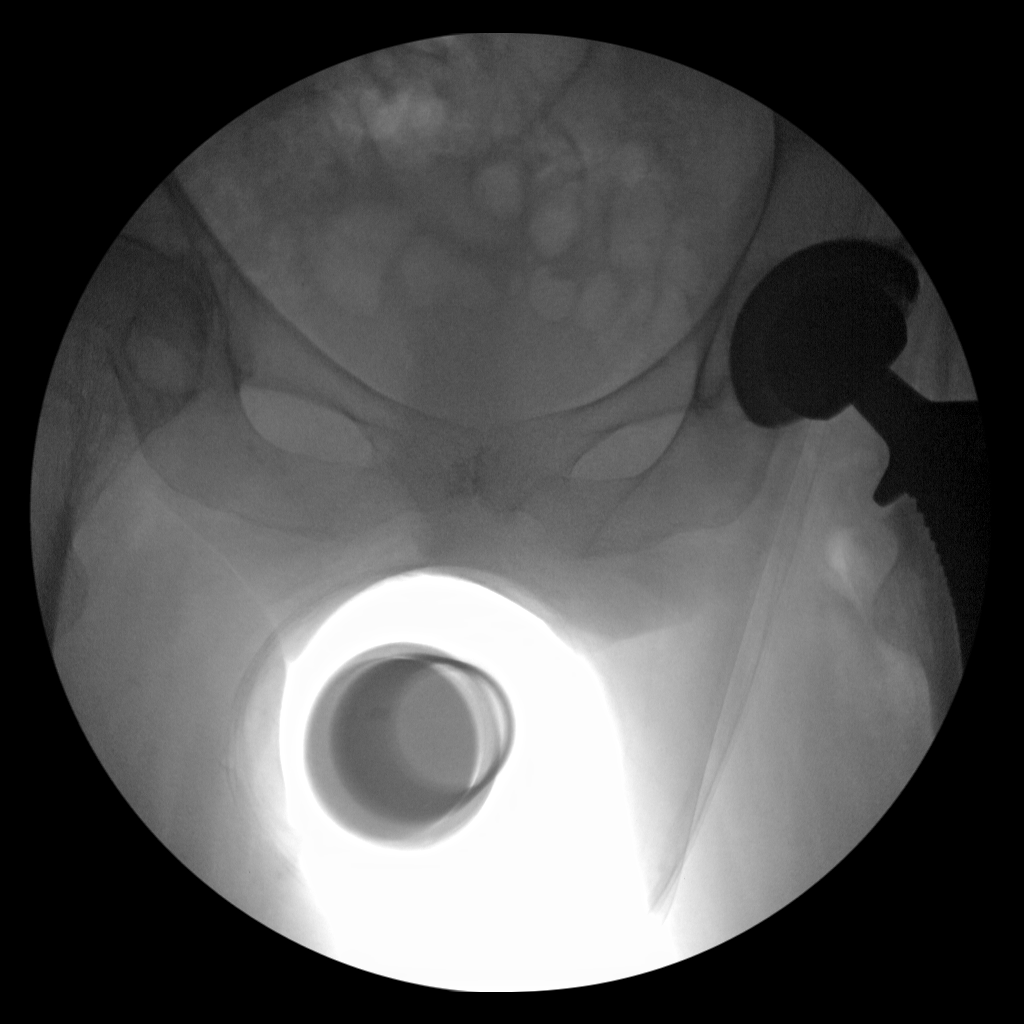

[4 of 4 positions shown; findings below may reference images not displayed]

FINDINGS: Interval left total hip arthroplasty without failure or
complication. No fracture or dislocation. Postsurgical changes in
the surrounding soft tissues.
IMPRESSION: Interval left total hip arthroplasty.
